# Patient Record
Sex: Male | Born: 1995 | Race: Black or African American | Hispanic: No | Marital: Single | Smoking: Current every day smoker
Health system: Southern US, Community
[De-identification: ages and names within clinical notes are randomized; demographics above are authoritative.]

---
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
## 2017-09-02 ENCOUNTER — Emergency Department
Admission: EM | Admit: 2017-09-02 | Discharge: 2017-09-02 | Disposition: A | Discharge status: Home / Self Care | Payer: Self-pay | Attending: Emergency Medicine | Admitting: Emergency Medicine

## 2017-09-02 ENCOUNTER — Emergency Department: Payer: Self-pay

## 2017-09-02 ENCOUNTER — Other Ambulatory Visit: Payer: Self-pay

## 2017-09-02 DIAGNOSIS — W25XXXA Contact with sharp glass, initial encounter: Secondary | ICD-10-CM | POA: Insufficient documentation

## 2017-09-02 DIAGNOSIS — Y929 Unspecified place or not applicable: Secondary | ICD-10-CM | POA: Insufficient documentation

## 2017-09-02 DIAGNOSIS — Y939 Activity, unspecified: Secondary | ICD-10-CM | POA: Insufficient documentation

## 2017-09-02 DIAGNOSIS — Y998 Other external cause status: Secondary | ICD-10-CM | POA: Insufficient documentation

## 2017-09-02 DIAGNOSIS — F17200 Nicotine dependence, unspecified, uncomplicated: Secondary | ICD-10-CM | POA: Insufficient documentation

## 2017-09-02 DIAGNOSIS — S61511A Laceration without foreign body of right wrist, initial encounter: Secondary | ICD-10-CM | POA: Insufficient documentation

## 2017-09-02 MED ORDER — BACITRACIN ZINC 500 UNIT/GM EX OINT
1.0000 "application " | TOPICAL_OINTMENT | Freq: Once | CUTANEOUS | Status: AC
  Administered 2017-09-02: 1 via TOPICAL
  Filled 2017-09-02: qty 0.9

## 2017-09-02 MED ORDER — LIDOCAINE HCL (PF) 1 % IJ SOLN
5.0000 mL | Freq: Once | INTRAMUSCULAR | Status: AC
  Administered 2017-09-02: 5 mL via INTRADERMAL
  Filled 2017-09-02: qty 5
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
## 2017-09-02 NOTE — ED Notes (Signed)
X-ray at bedside
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
## 2017-09-02 NOTE — Discharge Instructions (Signed)
Do not get the sutured area wet for 24 hours. After 24 hours, shower/bathe as usual and pat the area dry. °Change the bandage 2 times per day and apply antibiotic ointment. °Leave open to air when at no risk of getting the area dirty, but cover at night before bed. °See your PCP in10 days for suture removal or sooner for signs or concern of infection. ° ° °

## 2018-10-01 IMAGING — DX DG WRIST COMPLETE 3+V*R*
4 series · 4 of 4 positions shown · non-contrast
Comparison: No recent prior.

CLINICAL DATA: Injury.  Laceration.

EXAM:
RIGHT WRIST - COMPLETE 3+ VIEW

[wrist ap (1 of 2)]
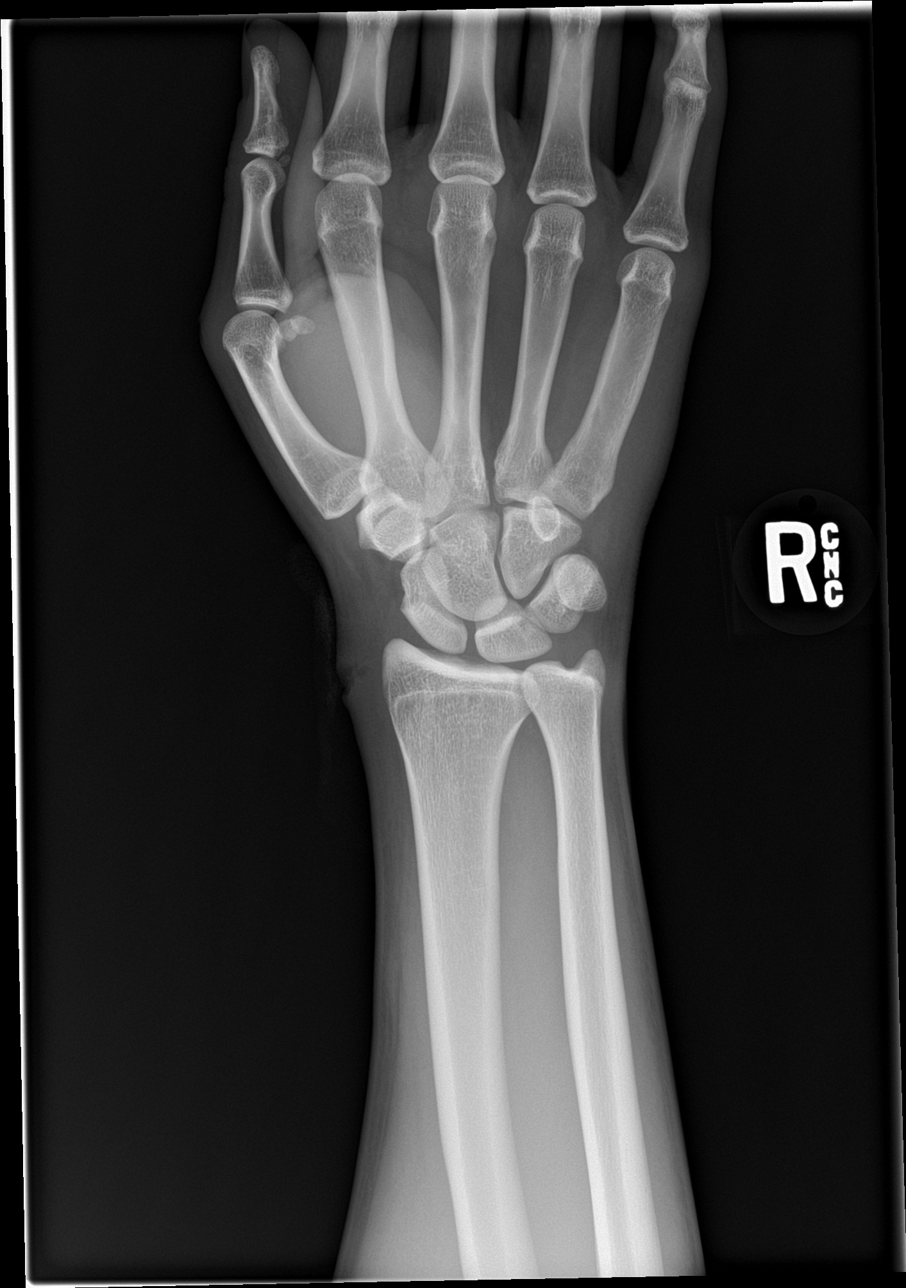

[wrist obl]
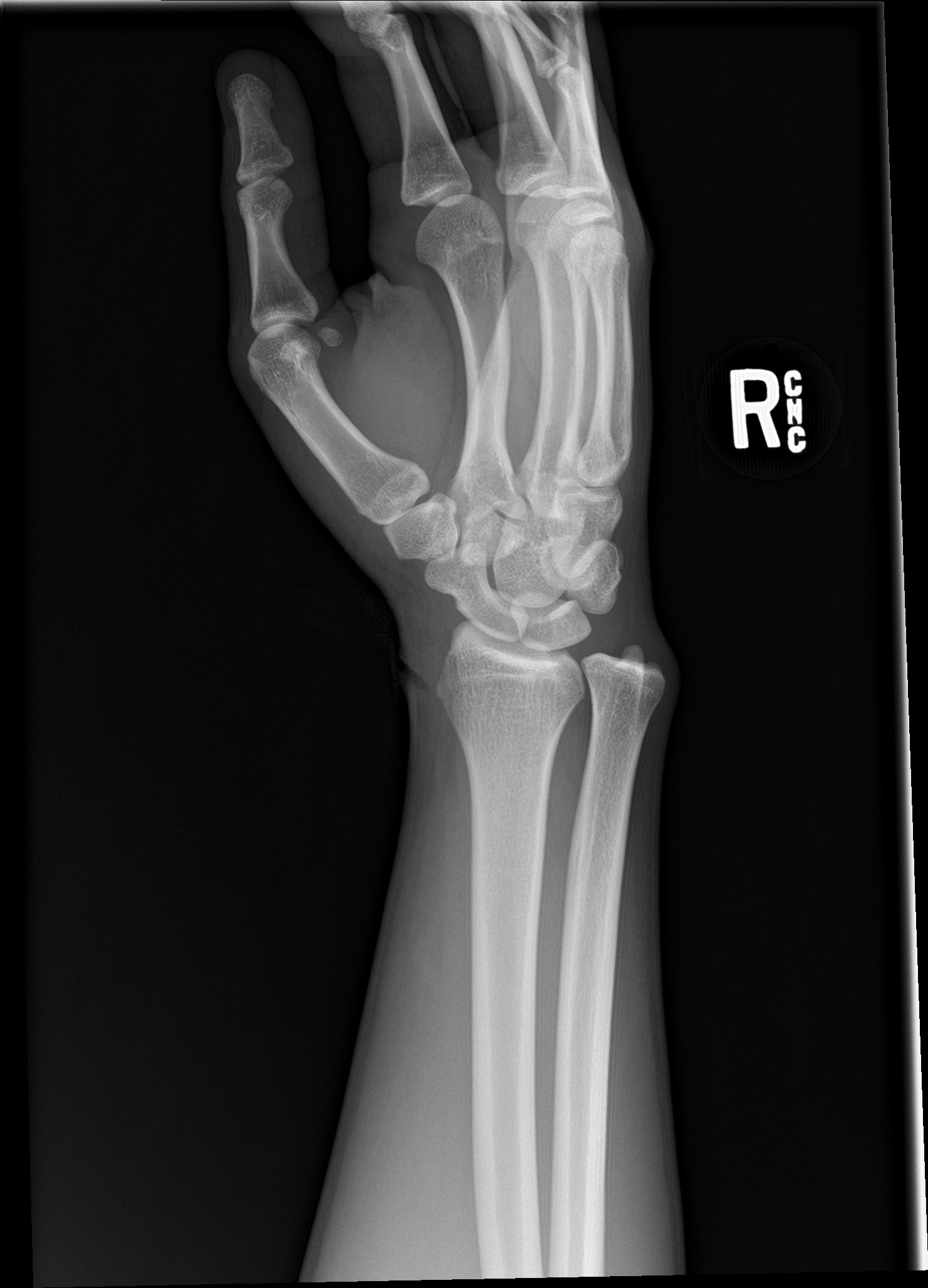

[wrist lat]
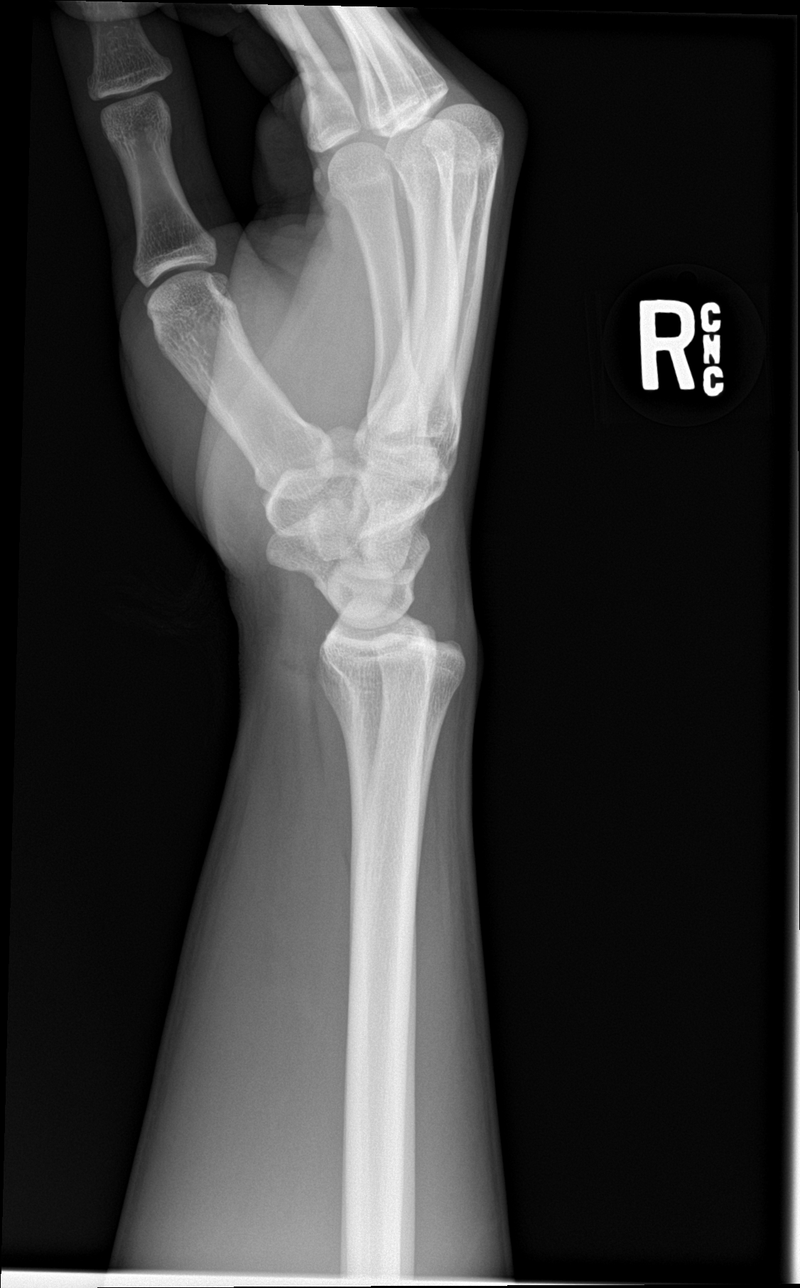

[wrist ap (2 of 2)]
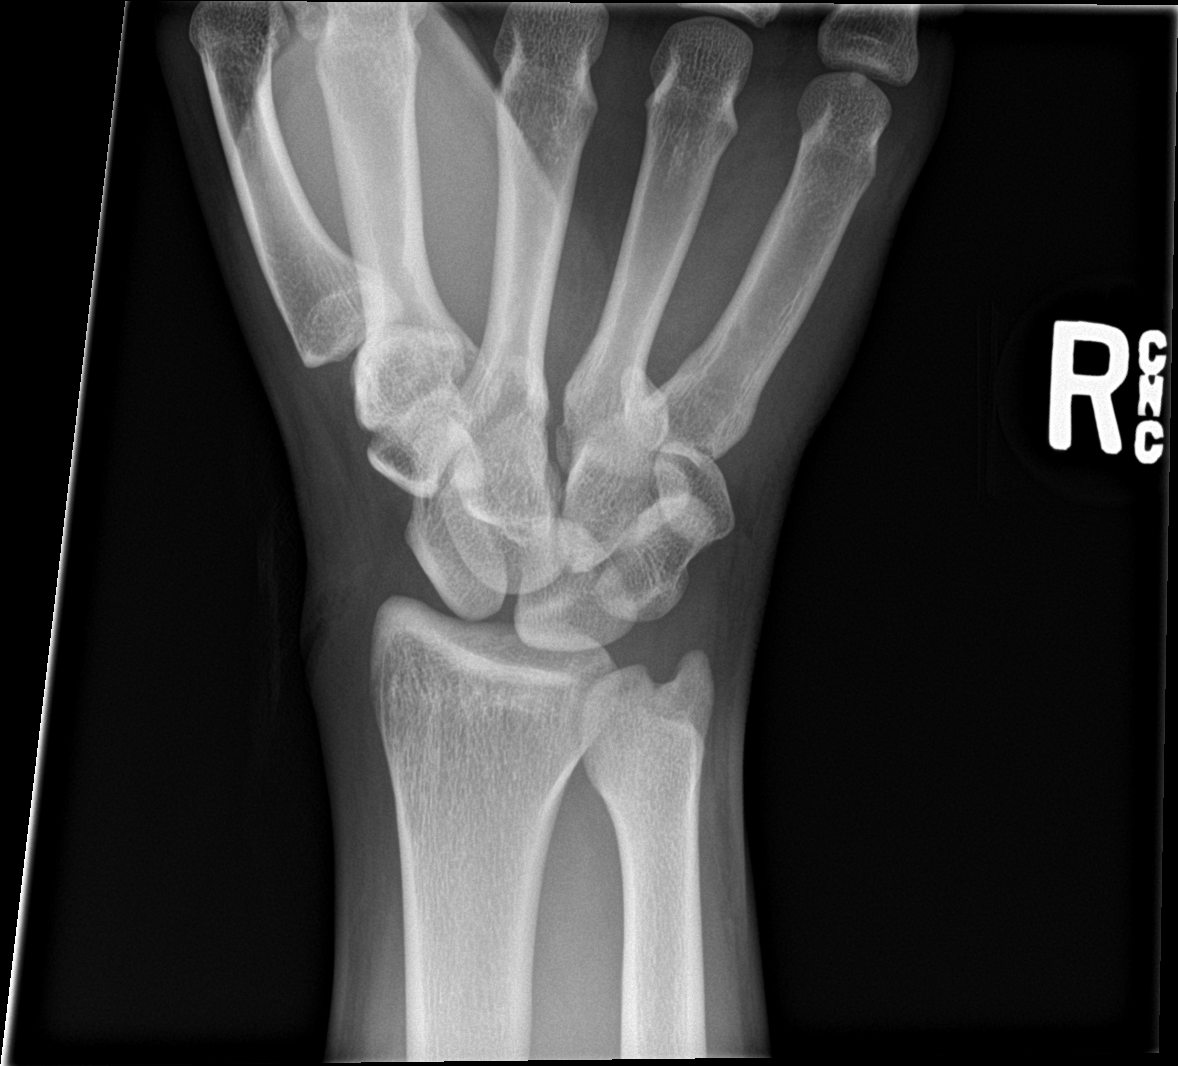

[4 of 4 positions shown; findings below may reference images not displayed]

FINDINGS: No acute bony or joint abnormality. No evidence of fracture
dislocation. Laceration noted of the radial aspect of the right
wrist. No radiopaque foreign body.
IMPRESSION: Laceration noted of the radial aspect of the right wrist. No
radiopaque foreign body. No acute bony abnormality.

## 2019-02-16 ENCOUNTER — Other Ambulatory Visit: Payer: Self-pay

## 2019-02-16 ENCOUNTER — Encounter: Payer: Self-pay | Admitting: Physician Assistant

## 2019-02-16 ENCOUNTER — Ambulatory Visit: Payer: Self-pay | Admitting: Physician Assistant

## 2019-02-16 DIAGNOSIS — Z113 Encounter for screening for infections with a predominantly sexual mode of transmission: Secondary | ICD-10-CM

## 2019-02-16 DIAGNOSIS — Z202 Contact with and (suspected) exposure to infections with a predominantly sexual mode of transmission: Secondary | ICD-10-CM

## 2019-02-16 MED ORDER — CEFTRIAXONE SODIUM 250 MG IJ SOLR
250.0000 mg | Freq: Once | INTRAMUSCULAR | Status: AC
  Administered 2019-02-16: 12:00:00 250 mg via INTRAMUSCULAR

## 2019-02-16 MED ORDER — AZITHROMYCIN 500 MG PO TABS
1000.0000 mg | ORAL_TABLET | Freq: Once | ORAL | Status: AC
  Administered 2019-02-16: 12:00:00 1000 mg via ORAL

## 2019-02-16 NOTE — Progress Notes (Signed)
    STI clinic/screening visit  Subjective:  Socorro Ebron is a 23 y.o. male being seen today for an STI screening visit. The patient reports they do not have symptoms.  Patient has the following medical conditions:  There are no active problems to display for this patient.    Chief Complaint  Patient presents with  . SEXUALLY TRANSMITTED DISEASE    HPI  Patient reports that he does not have symptoms.  States that his partner tested positive for GC and Chlamydia and he wants testing and treatment.  See flowsheet for further details and programmatic requirements.    The following portions of the patient's history were reviewed and updated as appropriate: allergies, current medications, past medical history, past social history, past surgical history and problem list.  Objective:  There were no vitals filed for this visit.  Physical Exam Constitutional:      General: He is not in acute distress.    Appearance: Normal appearance.  HENT:     Head: Normocephalic and atraumatic.     Mouth/Throat:     Mouth: Mucous membranes are moist.     Pharynx: Oropharynx is clear. No oropharyngeal exudate or posterior oropharyngeal erythema.  Eyes:     Conjunctiva/sclera: Conjunctivae normal.  Neck:     Musculoskeletal: Neck supple.  Pulmonary:     Effort: Pulmonary effort is normal.  Abdominal:     Palpations: There is no mass.     Tenderness: There is no abdominal tenderness. There is no guarding or rebound.  Lymphadenopathy:     Cervical: No cervical adenopathy.  Skin:    General: Skin is warm and dry.     Findings: No bruising, erythema, lesion or rash.  Neurological:     Mental Status: He is alert and oriented to person, place, and time.  Psychiatric:        Mood and Affect: Mood normal.        Behavior: Behavior normal.        Thought Content: Thought content normal.        Judgment: Judgment normal.    Genital exam deferred due to patient without symptoms and declines.     Assessment and Plan:  Farhad Burleson is a 23 y.o. male presenting to the [facility] Department for STI screening  1. Screening for STD (sexually transmitted disease) Patient without symptoms. Rec condoms with all sex. Await test results.  Counseled that RN will call if needs to RTC for further treatment once results are back. - Ct, Ng, Mycoplasmas NAA, Urine - HIV/HCV [facility] - Syphilis Serology, [facility]  2. Venereal disease contact Will treat as a contact to GC and Chlamydia with Azithromycin 1 g po DOT and Ceftriaxone 250mg  IM today No sex for 7 days and until after partner completes treatment. RTC if vomits < 2 hr after completing medicine for re-treatment.  - azithromycin (ZITHROMAX) tablet 1,000 mg - cefTRIAXone (ROCEPHIN) injection 250 mg     No follow-ups on file.  No future appointments.  Jerene Dilling, PA

## 2019-02-20 LAB — CT, NG, MYCOPLASMAS NAA, URINE
Chlamydia trachomatis, NAA: POSITIVE — AB
Mycoplasma genitalium NAA: POSITIVE — AB
Mycoplasma hominis NAA: NEGATIVE
Neisseria gonorrhoeae, NAA: POSITIVE — AB
Ureaplasma spp NAA: POSITIVE — AB

## 2019-02-27 LAB — HM HEPATITIS C SCREENING LAB: HM Hepatitis Screen: NEGATIVE

## 2019-02-27 LAB — HM HIV SCREENING LAB: HM HIV Screening: NEGATIVE

## 2019-03-23 ENCOUNTER — Other Ambulatory Visit: Payer: Self-pay

## 2019-03-23 DIAGNOSIS — Z20822 Contact with and (suspected) exposure to covid-19: Secondary | ICD-10-CM

## 2019-03-25 LAB — NOVEL CORONAVIRUS, NAA: SARS-CoV-2, NAA: NOT DETECTED

## 2021-05-13 ENCOUNTER — Other Ambulatory Visit: Payer: Self-pay

## 2021-05-13 ENCOUNTER — Encounter: Payer: Self-pay | Admitting: Emergency Medicine

## 2021-05-13 ENCOUNTER — Emergency Department
Admission: EM | Admit: 2021-05-13 | Discharge: 2021-05-13 | Disposition: A | Discharge status: Home / Self Care | Payer: Medicaid Other | Attending: Emergency Medicine | Admitting: Emergency Medicine

## 2021-05-13 DIAGNOSIS — K0889 Other specified disorders of teeth and supporting structures: Secondary | ICD-10-CM | POA: Insufficient documentation

## 2021-05-13 DIAGNOSIS — F17200 Nicotine dependence, unspecified, uncomplicated: Secondary | ICD-10-CM | POA: Insufficient documentation

## 2021-05-13 MED ORDER — AMOXICILLIN 875 MG PO TABS: 875.0000 mg | ORAL_TABLET | Freq: Two times a day (BID) | ORAL | 0 refills | Status: AC

## 2021-05-13 MED ORDER — KETOROLAC TROMETHAMINE 30 MG/ML IJ SOLN
30.0000 mg | Freq: Once | INTRAMUSCULAR | Status: AC
  Administered 2021-05-13: 30 mg via INTRAMUSCULAR
  Filled 2021-05-13: qty 1

## 2021-05-13 MED ORDER — KETOROLAC TROMETHAMINE 10 MG PO TABS: 10.0000 mg | ORAL_TABLET | Freq: Four times a day (QID) | ORAL | 0 refills | Status: AC | PRN

## 2021-05-13 NOTE — ED Triage Notes (Signed)
Pt reports that for the last 3 days he has had left lower and upper tooth pain, he thinks that it may be his wisdom teeth. It is causing him to have a headache.

## 2021-05-13 NOTE — Discharge Instructions (Addendum)
OPTIONS FOR DENTAL FOLLOW UP CARE ° °[place] [facility] and [facility] - Local Safety Net Dental Clinics °http://www.ncdhhs.gov/dph/oralhealth/services/safetynetclinics.htm °  °[facility] (336-562-3123) ° °[facility] [place] ([phone]) ° °[facility] ([phone] ext [phone]) ° °[facility] ([phone]) ° °[facility] ([phone]) °This clinic caters to the indigent population and is on a lottery system. °Location: °[facility], [address], [place] °Clinic Hours: °Wednesdays from 6pm - 9pm, patients seen by a lottery system. °For dates, call or go to [email or web address].[facility].edu/[facility]/patients/Dental-[facility] °Services: °Cleanings, fillings and simple extractions. °Payment Options: °DENTAL WORK IS FREE OF CHARGE. Bring proof of income or support. °Best way to get seen: °Arrive at 5:15 pm - this is a lottery, NOT first come/first serve, so arriving earlier will not increase your chances of being seen. °  °  °[facility] °[phone] °Select option 1 for emergencies °  °Location: °[facility], [address], [place] °Clinic Hours: °No walk-ins accepted - call the day before to schedule an appointment. °Check in times are 9:30 am and 1:30 pm. °Services: °Simple extractions, temporary fillings, pulpectomy/pulp debridement, uncomplicated abscess drainage. °Payment Options: °PAYMENT IS DUE AT THE TIME OF SERVICE.  Fee is usually $100-200, additional surgical procedures (e.g. abscess drainage) may be extra. °Cash, checks, Visa/MasterCard accepted.  Can file Medicaid if patient is covered for dental - patient should call case worker to check. °No discount for [facility] patients. °Best way to get seen: °MUST call the day before and get onto the schedule. Can usually be seen the next 1-2 days. No walk-ins accepted. °  °  °[facility] °[phone] °   °Location: °[facility], [address], [place] °Clinic Hours: °M, W, Th, F 8am or 1:30pm, Tues 9a or 1:30 - first come/first served. °Services: °Simple extractions, temporary fillings, uncomplicated abscess drainage.  You do not need to be an [place] resident. °Payment Options: °PAYMENT IS DUE AT THE TIME OF SERVICE. °Dental insurance, otherwise sliding scale - bring proof of income or support. °Depending on income and treatment needed, cost is usually $50-200. °Best way to get seen: °Arrive early as it is first come/first served. °  °  °[facility] °[phone] °  °Location: °[address] °Clinic Hours: °Mon-Thu 8a-5p °Services: °Most basic dental services including extractions and fillings. °Payment Options: °PAYMENT IS DUE AT THE TIME OF SERVICE. °Sliding scale, up to 50% off - bring proof if income or support. °Medicaid with dental option accepted. °Best way to get seen: °Call to schedule an appointment, can usually be seen within 2 weeks OR they will try to see walk-ins - show up at 8a or 2p (you may have to wait). °  °  °[facility] °[phone] °[place] RESIDENTS ONLY °  °Location: °[facility], [address], [place],  [address] °Clinic Hours: By appointment only. °Monday - Thursday 8am-5pm, Friday 8am-12pm °Services: Cleanings, fillings, extractions. °Payment Options: °PAYMENT IS DUE AT THE TIME OF SERVICE. °Cash, Visa or MasterCard. Sliding scale - $30 minimum per service. °Best way to get seen: °Come in to office, complete packet and make an appointment - need proof of income °or support monies for each household member and proof of [place] residence. °Usually takes about a month to get in. °  °  °[facility] °[phone] °  °Location: °[address]   [facility] Hours: Walk-in Urgent Care Dental Services are offered Monday-Friday  mornings only. The numbers of emergencies accepted daily is limited to the number of providers available. Maximum 15 - Mondays, Wednesdays & Thursdays Maximum 10 - Tuesdays & Fridays Services: You do not need to be a [facility] resident to be seen for a dental emergency. Emergencies are defined as pain, swelling, abnormal bleeding, or dental trauma. Walkins will receive x-rays if needed. NOTE: Dental cleaning is not an emergency. Payment Options: PAYMENT IS DUE AT THE TIME OF SERVICE. Minimum co-pay is $40.00 for uninsured patients. Minimum co-pay is $3.00 for Medicaid with dental coverage. Dental Insurance is accepted and must be presented at time of visit. Medicare does not cover dental. Forms of payment: Cash, credit card, checks. Best way to get seen: If not previously registered with the clinic, walk-in dental registration begins at 7:15 am and is on a first come/first serve basis. If previously registered with the clinic, call to make an appointment.     [facility] [phone] [place] RESIDENTS ONLY   Location: [address] [place], [place] Clinic Hours: Mon-Thu 10a-2p Services: Extractions only! Payment Options: FREE (donations accepted) - bring proof of income or support Best way to get seen: Call and schedule an appointment OR come at 8am on the 1st Monday of every month (except for holidays) when it is first come/first served.     [facility] [phone]   Location: [address] [place], [place] Clinic Hours: Friday mornings Services, Payment Options, Best way to get seen: Call for info  You can take amoxicillin twice daily for the next 10 days. You can take Toradol up to 4 times daily for the next 5 days.

## 2021-05-13 NOTE — ED Provider Notes (Signed)
[facility]-EMERGENCY DEPARTMENT  ____________________________________________  Time seen: Approximately 9:28 PM  I have reviewed the triage vital signs and the nursing notes.   HISTORY  Chief Complaint Dental Pain   Historian Patient     HPI Eric Strong is a 25 y.o. male presents to the emergency department for left upper and lower dental pain for the past 3 days.  Patient cannot localize his pain to a certain tooth.  He thinks that he may have some swelling of the left upper jaw.  No fever or chills.  No pain underneath the tongue or difficulty swallowing.  He has been managing his own secretions at home.   History reviewed. No pertinent past medical history.   Immunizations up to date:  Yes.     History reviewed. No pertinent past medical history.  There are no problems to display for this patient.   No past surgical history on file.  Prior to Admission medications   Medication Sig Start Date End Date Taking? Authorizing Provider  amoxicillin (AMOXIL) 875 MG tablet Take 1 tablet (875 mg total) by mouth 2 (two) times daily for 10 days. 05/13/21 05/23/21 Yes Pia Mau M, PA-C  ketorolac (TORADOL) 10 MG tablet Take 1 tablet (10 mg total) by mouth every 6 (six) hours as needed for up to 5 days. 05/13/21 05/18/21 Yes Orvil Feil, PA-C    Allergies Patient has no known allergies.  No family history on file.  Social History Social History   Tobacco Use   Smoking status: Every Day   Smokeless tobacco: Former  Substance Use Topics   Alcohol use: Yes   Drug use: Yes    Types: Marijuana     Review of Systems  Constitutional: No fever/chills Eyes:  No discharge ENT: Patient has dental pain.  Respiratory: no cough. No SOB/ use of accessory muscles to breath Gastrointestinal:   No nausea, no vomiting.  No diarrhea.  No constipation. Musculoskeletal: Negative for musculoskeletal pain. Skin: Negative for rash, abrasions, lacerations,  ecchymosis.    ____________________________________________   PHYSICAL EXAM:  VITAL SIGNS: ED Triage Vitals  Enc Vitals Group     BP 05/13/21 1526 130/80     Pulse Rate 05/13/21 1526 60     Resp 05/13/21 1526 20     Temp 05/13/21 1526 98.5 F (36.9 C)     Temp Source 05/13/21 1526 Oral     SpO2 05/13/21 1526 96 %     Weight 05/13/21 1527 165 lb (74.8 kg)     Height 05/13/21 1527 5\' 7"  (1.702 m)     Head Circumference --      Peak Flow --      Pain Score 05/13/21 1526 8     Pain Loc --      Pain Edu? --      Excl. in GC? --      Constitutional: Alert and oriented. Well appearing and in no acute distress. Eyes: Conjunctivae are normal. PERRL. EOMI. Head: Atraumatic. ENT:      Nose: No congestion/rhinnorhea.      Mouth/Throat: Mucous membranes are moist.  Dentition appears exceptionally healthy.  No pain underneath the tongue. Neck: No stridor.  No cervical spine tenderness to palpation. Cardiovascular: Normal rate, regular rhythm. Normal S1 and S2.  Good peripheral circulation. Respiratory: Normal respiratory effort without tachypnea or retractions. Lungs CTAB. Good air entry to the bases with no decreased or absent breath sounds Gastrointestinal: Bowel sounds x 4 quadrants. Soft and nontender to palpation. No  guarding or rigidity. No distention. Musculoskeletal: Full range of motion to all extremities. No obvious deformities noted Neurologic:  Normal for age. No gross focal neurologic deficits are appreciated.  Skin:  Skin is warm, dry and intact. No rash noted. Psychiatric: Mood and affect are normal for age. Speech and behavior are normal.   ____________________________________________   LABS (all labs ordered are listed, but only abnormal results are displayed)  Labs Reviewed - No data to display ____________________________________________  EKG   ____________________________________________  RADIOLOGY   No results  found.  ____________________________________________    PROCEDURES  Procedure(s) performed:     Procedures     Medications  ketorolac (TORADOL) 30 MG/ML injection 30 mg (30 mg Intramuscular Given 05/13/21 1836)     ____________________________________________   INITIAL IMPRESSION / ASSESSMENT AND PLAN / ED COURSE  Pertinent labs & imaging results that were available during my care of the patient were reviewed by me and considered in my medical decision making (see chart for details).       Assessment and plan Dental pain 25 year old male presents to the emergency department with both upper and lower dental pain.  Vital signs are reassuring at triage.  On physical exam, patient was alert, active and nontoxic-appearing.  We will treat with high-dose amoxicillin and provide dental resources in patient's discharge paperwork.  Return precautions were given to return with new or worsening symptoms.    ____________________________________________  FINAL CLINICAL IMPRESSION(S) / ED DIAGNOSES  Final diagnoses:  Pain, dental      NEW MEDICATIONS STARTED DURING THIS VISIT:  ED Discharge Orders          Ordered    ketorolac (TORADOL) 10 MG tablet  Every 6 hours PRN        05/13/21 1829    amoxicillin (AMOXIL) 875 MG tablet  2 times daily        05/13/21 1829                This chart was dictated using voice recognition software/Dragon. Despite best efforts to proofread, errors can occur which can change the meaning. Any change was purely unintentional.     Orvil Feil, PA-C 05/13/21 2132    Gilles Chiquito, MD 05/14/21 1023

## 2022-08-23 ENCOUNTER — Ambulatory Visit
Admission: EM | Admit: 2022-08-23 | Discharge: 2022-08-23 | Disposition: A | Discharge status: Home / Self Care | Payer: Medicaid Other | Attending: Nurse Practitioner | Admitting: Nurse Practitioner

## 2022-08-23 DIAGNOSIS — B349 Viral infection, unspecified: Secondary | ICD-10-CM

## 2022-08-23 DIAGNOSIS — R051 Acute cough: Secondary | ICD-10-CM | POA: Diagnosis not present

## 2022-08-23 DIAGNOSIS — Z1152 Encounter for screening for COVID-19: Secondary | ICD-10-CM | POA: Insufficient documentation

## 2022-08-23 LAB — RESP PANEL BY RT-PCR (RSV, FLU A&B, COVID)  RVPGX2
Influenza A by PCR: NEGATIVE
Influenza B by PCR: NEGATIVE
Resp Syncytial Virus by PCR: NEGATIVE
SARS Coronavirus 2 by RT PCR: NEGATIVE

## 2022-08-23 LAB — GROUP A STREP BY PCR: Group A Strep by PCR: NOT DETECTED

## 2022-08-23 NOTE — Discharge Instructions (Signed)
Your symptoms and exam are consistent for a viral illness. Please treat your symptoms with over the counter cough medication, tylenol or ibuprofen, humidifier, and rest. Viral illnesses can last 7-14 days. Please follow up with your PCP if your symptoms are not improving. Please go to the ER for any worsening symptoms. This includes but is not limited to fever you can not control with tylenol or ibuprofen, you are not able to stay hydrated, you have shortness of breath or chest pain.  Thank you for choosing [facility] for your healthcare needs. I hope you feel better soon!  

## 2022-08-23 NOTE — ED Provider Notes (Addendum)
[facility]    CSN: [id number] Arrival date & time: 08/23/22  1347      History   Chief Complaint No chief complaint on file.   HPI Eric Strong. is a 27 y.o. male  presents for evaluation of URI symptoms for 3 days. Patient reports associated symptoms of cough, congestion, sore throat, subjective fevers, runny nose. Denies N/V/D, documented fevers, ear pain, shortness of breath, body aches. Patient does not have a hx of asthma.  He does smoke marijuana.  Son and daughter have similar symptoms.  Pt has taken Tylenol OTC for symptoms. Pt has no other concerns at this time.   HPI  History reviewed. No pertinent past medical history.  There are no problems to display for this patient.   History reviewed. No pertinent surgical history.     Home Medications    Prior to Admission medications   Not on File    Family History History reviewed. No pertinent family history.  Social History Social History   Tobacco Use   Smoking status: Every Day   Smokeless tobacco: Former  Substance Use Topics   Alcohol use: Never   Drug use: Yes    Types: Marijuana     Allergies   Patient has no known allergies.   Review of Systems Review of Systems  Constitutional:  Positive for fever.  HENT:  Positive for congestion and sore throat.   Respiratory:  Positive for cough.      Physical Exam Triage Vital Signs ED Triage Vitals  Enc Vitals Group     BP 08/23/22 1402 126/80     Pulse Rate 08/23/22 1402 81     Resp 08/23/22 1402 18     Temp 08/23/22 1402 98.8 F (37.1 C)     Temp Source 08/23/22 1402 Oral     SpO2 08/23/22 1402 95 %     Weight 08/23/22 1359 155 lb (70.3 kg)     Height 08/23/22 1359 5\' 7"  (1.702 m)     Head Circumference --      Peak Flow --      Pain Score 08/23/22 1358 4     Pain Loc --      Pain Edu? --      Excl. in [place]? --    No data found.  Updated Vital Signs BP 126/80 (BP Location: Right Arm)   Pulse 81   Temp 98.8 F  (37.1 C) (Oral)   Resp 18   Ht 5\' 7"  (1.702 m)   Wt 155 lb (70.3 kg)   SpO2 95%   BMI 24.28 kg/m   Visual Acuity Right Eye Distance:   Left Eye Distance:   Bilateral Distance:    Right Eye Near:   Left Eye Near:    Bilateral Near:     Physical Exam Vitals and nursing note reviewed.  Constitutional:      General: He is not in acute distress.    Appearance: Normal appearance. He is not ill-appearing or toxic-appearing.  HENT:     Head: Normocephalic and atraumatic.     Right Ear: Tympanic membrane and ear canal normal.     Left Ear: Tympanic membrane and ear canal normal.     Nose: Congestion present.     Mouth/Throat:     Mouth: Mucous membranes are moist.     Pharynx: Posterior oropharyngeal erythema present.  Eyes:     Pupils: Pupils are equal, round, and reactive to light.  Cardiovascular:  Rate and Rhythm: Normal rate and regular rhythm.     Heart sounds: Normal heart sounds.  Pulmonary:     Effort: Pulmonary effort is normal.     Breath sounds: Normal breath sounds.  Musculoskeletal:     Cervical back: Normal range of motion and neck supple.  Lymphadenopathy:     Cervical: No cervical adenopathy.  Skin:    General: Skin is warm and dry.  Neurological:     General: No focal deficit present.     Mental Status: He is alert and oriented to person, place, and time.  Psychiatric:        Mood and Affect: Mood normal.        Behavior: Behavior normal.      UC Treatments / Results  Labs (all labs ordered are listed, but only abnormal results are displayed) Labs Reviewed  RESP PANEL BY RT-PCR (RSV, FLU A&B, COVID)  [id number]  GROUP A STREP BY PCR    EKG   Radiology No results found.  Procedures Procedures (including critical care time)  Medications Ordered in UC Medications - No data to display  Initial Impression / Assessment and Plan / UC Course  I have reviewed the triage vital signs and the nursing notes.  Pertinent labs & imaging results  that were available during my care of the patient were reviewed by me and considered in my medical decision making (see chart for details).     Negative strep PCR, negative flu/COVID/RSV PCR Discussed viral illness and symptomatic treatment Rest and fluids OTC cough medicine as needed Follow-up with PCP 2 days for recheck ER precautions reviewed and patient verbalized understanding Final Clinical Impressions(s) / UC Diagnoses   Final diagnoses:  Acute cough  Viral illness     Discharge Instructions      Your symptoms and exam are consistent for a viral illness. Please treat your symptoms with over the counter cough medication, tylenol or ibuprofen, humidifier, and rest. Viral illnesses can last 7-14 days. Please follow up with your PCP if your symptoms are not improving. Please go to the ER for any worsening symptoms. This includes but is not limited to fever you can not control with tylenol or ibuprofen, you are not able to stay hydrated, you have shortness of breath or chest pain.  Thank you for choosing [facility] for your healthcare needs. I hope you feel better soon!      ED Prescriptions   None    PDMP not reviewed this encounter.   Melynda Ripple, NP 08/23/22 1539    Melynda Ripple, NP 08/23/22 [phone]

## 2022-08-23 NOTE — ED Triage Notes (Signed)
Patient presents with runny nose, fever, cough, sneezing, sore throat x day 3. Patient states he took Tylenol.

## 2023-04-02 ENCOUNTER — Emergency Department
Admission: EM | Admit: 2023-04-02 | Discharge: 2023-04-02 | Discharge status: Against Medical Advice | Payer: Medicaid Other | Attending: Emergency Medicine | Admitting: Emergency Medicine

## 2023-04-02 ENCOUNTER — Other Ambulatory Visit: Payer: Self-pay

## 2023-04-02 DIAGNOSIS — Z202 Contact with and (suspected) exposure to infections with a predominantly sexual mode of transmission: Secondary | ICD-10-CM | POA: Insufficient documentation

## 2023-04-02 DIAGNOSIS — Z5321 Procedure and treatment not carried out due to patient leaving prior to being seen by health care provider: Secondary | ICD-10-CM | POA: Diagnosis not present

## 2023-04-02 LAB — URINALYSIS, ROUTINE W REFLEX MICROSCOPIC
Bacteria, UA: NONE SEEN
Bilirubin Urine: NEGATIVE
Glucose, UA: NEGATIVE mg/dL
Hgb urine dipstick: NEGATIVE
Ketones, ur: 5 mg/dL — AB
Nitrite: NEGATIVE
Protein, ur: NEGATIVE mg/dL
Specific Gravity, Urine: 1.029 (ref 1.005–1.030)
pH: 5 (ref 5.0–8.0)

## 2023-04-02 LAB — CHLAMYDIA/NGC RT PCR (ARMC ONLY)
Chlamydia Tr: DETECTED — AB
N gonorrhoeae: DETECTED — AB

## 2023-04-02 NOTE — ED Triage Notes (Signed)
Pt was exposed to Chlamydia and Gonorrhea from partner. Pt states has been having burning with urination 2 days ago.

## 2023-04-03 NOTE — ED Notes (Addendum)
This RN atttmpted to call this pt 3 times with no answer. Pt was attempted to call due to STD results.   Call 1- A person answered the phone Call 2-HIPAA compliant VM left to call this Charge RN back to discuss his last visit and the need to come back for TX of STD results.

## 2023-04-05 ENCOUNTER — Telehealth: Payer: Self-pay | Admitting: Emergency Medicine

## 2023-04-05 NOTE — Telephone Encounter (Signed)
Called patient due to left emergency department before provider exam to inquire about condition and follow up plans. Patient had std testing and needs treatment.  I left him a message and asked him to call me about follow up plans.  I would like him to do follow up at [facility] or urgent care, but he is always welcome to return here.
